# Patient Record
Sex: Female | Born: 2000 | Race: White | Hispanic: No | Marital: Single | Smoking: Never smoker
Health system: Southern US, Community
[De-identification: ages and names within clinical notes are randomized; demographics above are authoritative.]

## PROBLEM LIST (undated history)

## (undated) ENCOUNTER — Ambulatory Visit: Admission: EM

## (undated) DIAGNOSIS — F419 Anxiety disorder, unspecified: Secondary | ICD-10-CM

## (undated) DIAGNOSIS — F32A Depression, unspecified: Secondary | ICD-10-CM

---

## 2019-11-20 ENCOUNTER — Ambulatory Visit (HOSPITAL_COMMUNITY)
Admission: EM | Admit: 2019-11-20 | Discharge: 2019-11-20 | Disposition: A | Discharge status: Home / Self Care | Payer: 59 | Attending: Physician Assistant | Admitting: Physician Assistant

## 2019-11-20 ENCOUNTER — Other Ambulatory Visit: Payer: Self-pay

## 2019-11-20 ENCOUNTER — Encounter (HOSPITAL_COMMUNITY): Payer: Self-pay

## 2019-11-20 ENCOUNTER — Ambulatory Visit (INDEPENDENT_AMBULATORY_CARE_PROVIDER_SITE_OTHER): Payer: 59

## 2019-11-20 DIAGNOSIS — S022XXA Fracture of nasal bones, initial encounter for closed fracture: Secondary | ICD-10-CM | POA: Diagnosis not present

## 2019-11-20 DIAGNOSIS — S0992XA Unspecified injury of nose, initial encounter: Secondary | ICD-10-CM

## 2019-11-20 NOTE — Discharge Instructions (Signed)
There is a small fracture in your nose  Protect your nose, no contact sports  Call ENT Monday if you wish to have follow up, these typically heal well, you may also follow up with your PCP

## 2019-11-20 NOTE — ED Provider Notes (Signed)
[facility]    CSN: [id number] Arrival date & time: 11/20/19  1850      History   Chief Complaint Chief Complaint  Patient presents with  . Nose Injury    HPI Evelyn Haney is a 19 y.o. female.   Patient reports for nose injury.  She reports she was at work today and she was pulling bit of a trash can get stuck and then released striking her in the face/nose.  She reports immediate pain in her nose.  She reports some nose bleeding.  She reports that she felt the nose might have been a little crooked at the time and she moved and felt a crack.  She has never injured her nose before.  It did not take that long for the bleeding to stop.  It has been painful since then.  She has noticed a little swelling and bruising.     History reviewed. No pertinent past medical history.  There are no problems to display for this patient.   History reviewed. No pertinent surgical history.  OB History   No obstetric history on file.      Home Medications    Prior to Admission medications   Not on File    Family History Family History  Family history unknown: Yes    Social History Social History   Tobacco Use  . Smoking status: Never Smoker  Substance Use Topics  . Alcohol use: Not on file  . Drug use: Not on file     Allergies   Patient has no known allergies.   Review of Systems Review of Systems   Physical Exam Triage Vital Signs ED Triage Vitals [11/20/19 1908]  Enc Vitals Group     BP 105/64     Pulse Rate 61     Resp 18     Temp 97.8 F (36.6 C)     Temp Source Oral     SpO2 99 %     Weight      Height      Head Circumference      Peak Flow      Pain Score 7     Pain Loc      Pain Edu?      Excl. in [place]?    No data found.  Updated Vital Signs BP 105/64 (BP Location: Left Arm)   Pulse 61   Temp 97.8 F (36.6 C) (Oral)   Resp 18   LMP 11/05/2019 (Exact Date)   SpO2 99%   Visual Acuity Right Eye Distance:   Left Eye  Distance:   Bilateral Distance:    Right Eye Near:   Left Eye Near:    Bilateral Near:     Physical Exam Vitals and nursing note reviewed.  Constitutional:      Appearance: Normal appearance.  HENT:     Head:     Comments: There is swelling over the bridge of the nose and some ecchymosis.  Possible early swelling lateral to the nose bilaterally.  There are some dried blood in the naris but no active bleeding.  Bridge of nose is very tender to palpation.  Appears in line. Skin:    General: Skin is warm and dry.  Neurological:     Mental Status: She is alert.      UC Treatments / Results  Labs (all labs ordered are listed, but only abnormal results are displayed) Labs Reviewed - No data to display  EKG  Radiology DG Nasal Bones  Result Date: 11/20/2019 CLINICAL DATA:  Hit in face with trash can lead today, felt crack when moving nose. EXAM: NASAL BONES - 3+ VIEW COMPARISON:  None. FINDINGS: Minimal irregularity of the nasal bone suspicious for nondisplaced fracture. The nasal septum is midline. Remaining included facial bones appear intact. IMPRESSION: Minimal irregularity of the nasal bone suspicious for nondisplaced fracture. Electronically Signed   By: Keith Rake M.D.   On: 11/20/2019 20:01    Procedures Procedures (including critical care time)  Medications Ordered in UC Medications - No data to display  Initial Impression / Assessment and Plan / UC Course  I have reviewed the triage vital signs and the nursing notes.  Pertinent labs & imaging results that were available during my care of the patient were reviewed by me and considered in my medical decision making (see chart for details).     #Fracture nasal bone Patient is a 19 year old presenting with acute fracture for nasal bone.  X-ray positive for fracture.  Minimal to no displacement.  Not actively bleeding.  Discussed protecting the nose.  Discussed that as swelling comes down she can evaluate her  satisfaction with the cosmetics and that she can follow-up with ENT for evaluation to ensure the best cosmetic result.  Discussed no contact sports and what to do with nosebleed recurs.  She verbalized understanding plan of care. Final Clinical Impressions(s) / UC Diagnoses   Final diagnoses:  Closed fracture of nasal bone, initial encounter     Discharge Instructions     There is a small fracture in your nose  Protect your nose, no contact sports  Call ENT Monday if you wish to have follow up, these typically heal well, you may also follow up with your PCP    ED Prescriptions    None     PDMP not reviewed this encounter.   Purnell Shoemaker, PA-C 11/21/19 [phone]

## 2019-11-20 NOTE — ED Triage Notes (Signed)
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  Pt states he nose was bleeding when it initially happened and now it is swollen & tender.

## 2020-12-01 IMAGING — DX DG NASAL BONES 3+V
3 series · 3 of 3 positions shown · non-contrast
Comparison: None.

CLINICAL DATA: Hit in face with trash can lead today, felt crack
when moving nose.

EXAM:
NASAL BONES - 3+ VIEW

[[person_name]]
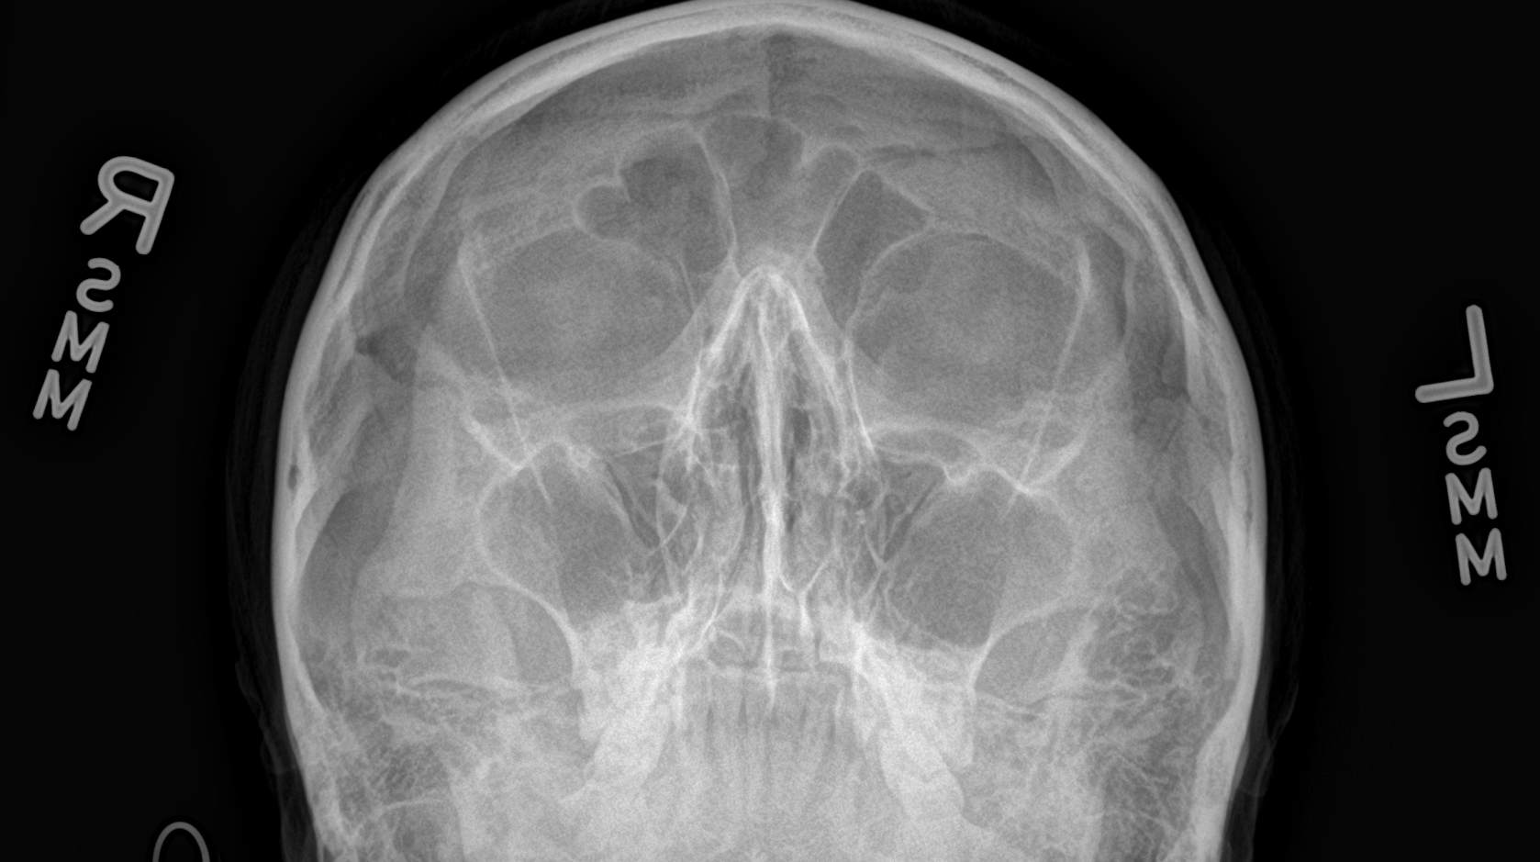

[nasal lat (1 of 2)]
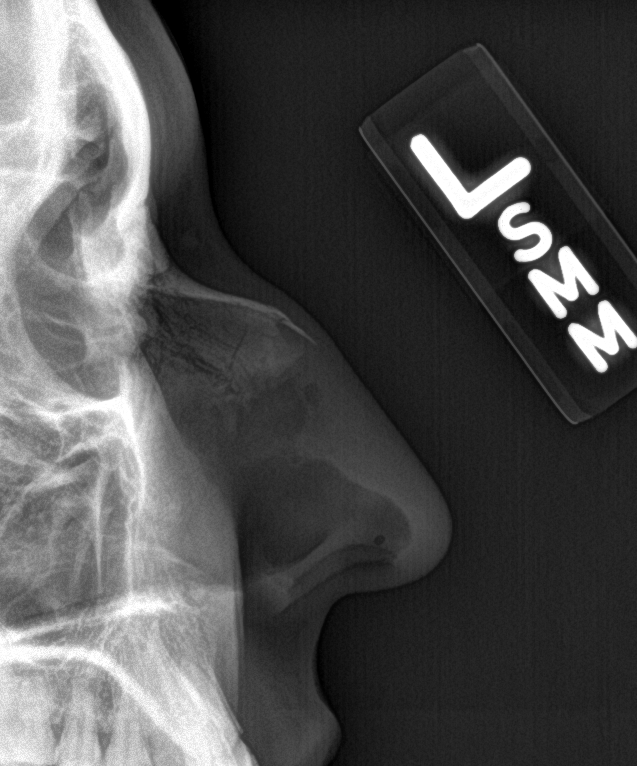

[nasal lat (2 of 2)]
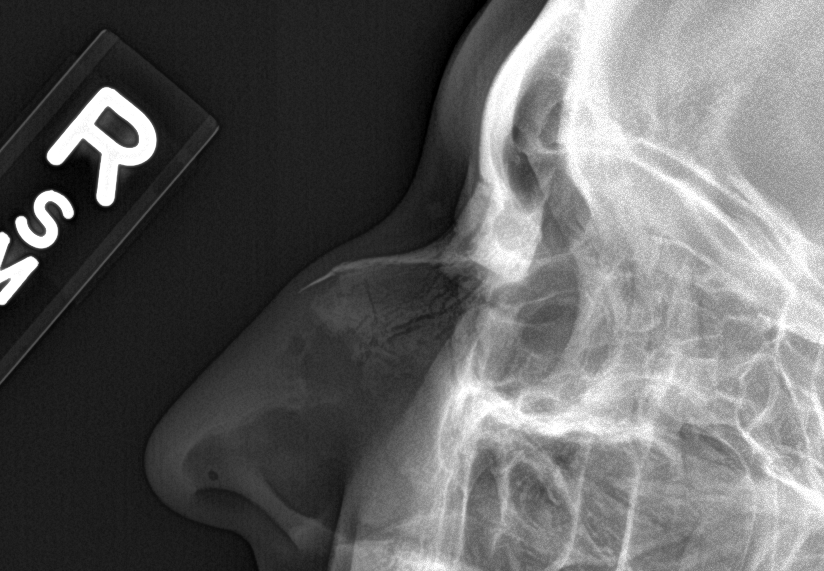

[3 of 3 positions shown; findings below may reference images not displayed]

FINDINGS: Minimal irregularity of the nasal bone suspicious for nondisplaced
fracture. The nasal septum is midline. Remaining included facial
bones appear intact.
IMPRESSION: Minimal irregularity of the nasal bone suspicious for nondisplaced
fracture.

## 2022-08-16 ENCOUNTER — Ambulatory Visit (HOSPITAL_COMMUNITY): Payer: Self-pay

## 2022-08-16 ENCOUNTER — Ambulatory Visit (HOSPITAL_COMMUNITY)
Admission: EM | Admit: 2022-08-16 | Discharge: 2022-08-16 | Disposition: A | Discharge status: Home / Self Care | Payer: BLUE CROSS/BLUE SHIELD | Attending: Family Medicine | Admitting: Family Medicine

## 2022-08-16 ENCOUNTER — Encounter (HOSPITAL_COMMUNITY): Payer: Self-pay | Admitting: Emergency Medicine

## 2022-08-16 ENCOUNTER — Other Ambulatory Visit: Payer: Self-pay

## 2022-08-16 ENCOUNTER — Ambulatory Visit (HOSPITAL_COMMUNITY): Payer: BLUE CROSS/BLUE SHIELD

## 2022-08-16 DIAGNOSIS — R051 Acute cough: Secondary | ICD-10-CM | POA: Diagnosis present

## 2022-08-16 DIAGNOSIS — Z1152 Encounter for screening for COVID-19: Secondary | ICD-10-CM | POA: Insufficient documentation

## 2022-08-16 DIAGNOSIS — R062 Wheezing: Secondary | ICD-10-CM | POA: Diagnosis present

## 2022-08-16 DIAGNOSIS — Z8616 Personal history of COVID-19: Secondary | ICD-10-CM | POA: Diagnosis not present

## 2022-08-16 DIAGNOSIS — J208 Acute bronchitis due to other specified organisms: Secondary | ICD-10-CM | POA: Diagnosis not present

## 2022-08-16 MED ORDER — PROMETHAZINE-DM 6.25-15 MG/5ML PO SYRP: 5.0000 mL | ORAL_SOLUTION | Freq: Three times a day (TID) | ORAL | 0 refills | Status: DC | PRN

## 2022-08-16 MED ORDER — PREDNISONE 20 MG PO TABS: 40.0000 mg | ORAL_TABLET | Freq: Every day | ORAL | 0 refills | Status: AC

## 2022-08-16 MED ORDER — ALBUTEROL SULFATE HFA 108 (90 BASE) MCG/ACT IN AERS
2.0000 | INHALATION_SPRAY | Freq: Once | RESPIRATORY_TRACT | Status: AC
  Administered 2022-08-16: 2 via RESPIRATORY_TRACT

## 2022-08-16 MED ORDER — ALBUTEROL SULFATE HFA 108 (90 BASE) MCG/ACT IN AERS
INHALATION_SPRAY | RESPIRATORY_TRACT | Status: AC
  Filled 2022-08-16: qty 6.7

## 2022-08-16 NOTE — ED Triage Notes (Signed)
Pt c/o increase cough, nasal congestion and sore throat since Monday. Denies any fever or chills.

## 2022-08-16 NOTE — Discharge Instructions (Signed)
I am concerned that you have a virus.  We will contact you if your COVID is positive.  Monitor your MyChart for these results.  Use albuterol every 4-6 hours as needed for shortness of breath and coughing fits.  Start prednisone in the morning.  Do not take NSAIDs with this medication including aspirin, ibuprofen/Advil, naproxen/Aleve.  You can use acetaminophen/Tylenol.  Take Promethazine DM for cough.  This will make you sleepy so do not drive drink alcohol with taking it.  You can use over-the-counter medications including Mucinex, Flonase, Tylenol for symptom relief.  If your symptoms or not improving within a week return for reevaluation.  If anything worsens you should be seen immediately including chest pain, shortness of breath, high fever, nausea/vomiting interfering with oral intake.

## 2022-08-16 NOTE — ED Provider Notes (Signed)
[place]    CSN: [id number] Arrival date & time: 08/16/22  1513      History   Chief Complaint Chief Complaint  Patient presents with   Cough    Bronchitis symptoms - Entered by patient   URI    HPI Evelyn Haney is a 22 y.o. female.   Patient presents today with a 4-day history of URI symptoms including cough, nasal congestion, sore throat, sinus pressure, chest tightness, shortness of breath.  She denies any chest pain, fever, nausea, vomiting, diarrhea.  She denies any known sick contacts but did start a new job where she is exposed to many people.  She has had COVID several years ago.  She had initial COVID vaccines but has not had more recent boosters.  She does have occasional seasonal allergies but these are generally managed with antihistamines.  Reports current symptoms are more extreme than previous episodes of allergies.  She has tried antihistamines as well as cold/flu medication without improvement of symptoms.  She denies any recent antibiotics or steroids.  She denies any significant past medical history including diabetes, cardiovascular disease, COPD, asthma, smoking.  She is confident that she is not pregnant.    History reviewed. No pertinent past medical history.  There are no problems to display for this patient.   History reviewed. No pertinent surgical history.  OB History   No obstetric history on file.      Home Medications    Prior to Admission medications   Medication Sig Start Date End Date Taking? Authorizing Provider  predniSONE (DELTASONE) 20 MG tablet Take 2 tablets (40 mg total) by mouth daily for 4 days. 08/16/22 08/20/22 Yes Guinn Delarosa K, PA-C  promethazine-dextromethorphan (PROMETHAZINE-DM) 6.25-15 MG/5ML syrup Take 5 mLs by mouth 3 (three) times daily as needed for cough. 08/16/22  Yes Raeann Offner, Derry Skill, PA-C    Family History Family History  Family history unknown: Yes    Social History Social History    Tobacco Use   Smoking status: Never     Allergies   Patient has no known allergies.   Review of Systems Review of Systems  Constitutional:  Positive for activity change. Negative for appetite change, fatigue and fever.  HENT:  Positive for congestion, sinus pressure and sore throat. Negative for sneezing.   Respiratory:  Positive for cough, chest tightness and shortness of breath.   Cardiovascular:  Negative for chest pain.  Gastrointestinal:  Negative for abdominal pain, diarrhea, nausea and vomiting.  Neurological:  Negative for dizziness, light-headedness and headaches.     Physical Exam Triage Vital Signs ED Triage Vitals  Enc Vitals Group     BP 08/16/22 1535 100/69     Pulse Rate 08/16/22 1535 92     Resp 08/16/22 1535 16     Temp 08/16/22 1535 98.8 F (37.1 C)     Temp Source 08/16/22 1535 Oral     SpO2 08/16/22 1535 98 %     Weight 08/16/22 1536 135 lb (61.2 kg)     Height 08/16/22 1536 '4\' 11"'$  (1.499 m)     Head Circumference --      Peak Flow --      Pain Score 08/16/22 1535 4     Pain Loc --      Pain Edu? --      Excl. in [place]? --    No data found.  Updated Vital Signs BP 100/69 (BP Location: Right Arm)   Pulse 92  Temp 98.8 F (37.1 C) (Oral)   Resp 16   Ht '4\' 11"'$  (1.499 m)   Wt 135 lb (61.2 kg)   LMP 07/20/2022 (Approximate)   SpO2 98%   BMI 27.27 kg/m   Visual Acuity Right Eye Distance:   Left Eye Distance:   Bilateral Distance:    Right Eye Near:   Left Eye Near:    Bilateral Near:     Physical Exam Vitals reviewed.  Constitutional:      General: She is awake. She is not in acute distress.    Appearance: Normal appearance. She is well-developed. She is not ill-appearing.     Comments: Very pleasant female who stated age in no acute distress sitting comfortably in exam room  HENT:     Head: Normocephalic and atraumatic.     Right Ear: Tympanic membrane, ear canal and external ear normal. Tympanic membrane is not erythematous or  bulging.     Left Ear: Tympanic membrane, ear canal and external ear normal. Tympanic membrane is not erythematous or bulging.     Nose:     Right Sinus: Maxillary sinus tenderness present. No frontal sinus tenderness.     Left Sinus: Maxillary sinus tenderness present. No frontal sinus tenderness.     Mouth/Throat:     Pharynx: Uvula midline. No oropharyngeal exudate or posterior oropharyngeal erythema.  Cardiovascular:     Rate and Rhythm: Normal rate and regular rhythm.     Heart sounds: Normal heart sounds, S1 normal and S2 normal. No murmur heard. Pulmonary:     Effort: Pulmonary effort is normal.     Breath sounds: Examination of the right-lower field reveals decreased breath sounds. Examination of the left-lower field reveals decreased breath sounds. Decreased breath sounds present. No wheezing, rhonchi or rales.     Comments: Reactive cough with deep breathing Psychiatric:        Behavior: Behavior is cooperative.      UC Treatments / Results  Labs (all labs ordered are listed, but only abnormal results are displayed) Labs Reviewed  SARS CORONAVIRUS 2 (TAT 6-24 HRS)    EKG   Radiology No results found.  Procedures Procedures (including critical care time)  Medications Ordered in UC Medications  albuterol (VENTOLIN HFA) 108 (90 Base) MCG/ACT inhaler 2 puff (2 puffs Inhalation Given 08/16/22 1611)    Initial Impression / Assessment and Plan / UC Course  I have reviewed the triage vital signs and the nursing notes.  Pertinent labs & imaging results that were available during my care of the patient were reviewed by me and considered in my medical decision making (see chart for details).     Patient is well-appearing, afebrile, nontoxic, nontachycardic.  No evidence of acute infection on physical exam that would warrant initiation of antibiotics.  Flu testing was deferred as she has been symptomatic for several days and this would not change management.  COVID  testing was obtained and is pending.  She is young and otherwise healthy so not a candidate for antivirals.  She was given several puffs of an albuterol inhaler with significant improvement of symptoms.  She was sent home with this medication with instruction to use this every 4-6 hours as needed.  Will start prednisone burst of 40 mg for 4 days.  Discussed that she should not take NSAIDs with this medication due to risk of GI bleeding but can use over-the-counter medications including Mucinex, Tylenol, Flonase.  She was given Promethazine DM for cough.  Discussed  that this can be sedating and she is not to drive or drink alcohol while taking it.  Recommended that she rest and drink plenty of fluids.  If she has any worsening or changing symptoms including fever, chest pain, shortness of breath, nausea/vomiting interfering with oral intake, weakness she needs to be seen immediately.  Strict return precautions given.  Work excuse note provided.  Final Clinical Impressions(s) / UC Diagnoses   Final diagnoses:  Viral bronchitis  Wheezing  Acute cough     Discharge Instructions      I am concerned that you have a virus.  We will contact you if your COVID is positive.  Monitor your MyChart for these results.  Use albuterol every 4-6 hours as needed for shortness of breath and coughing fits.  Start prednisone in the morning.  Do not take NSAIDs with this medication including aspirin, ibuprofen/Advil, naproxen/Aleve.  You can use acetaminophen/Tylenol.  Take Promethazine DM for cough.  This will make you sleepy so do not drive drink alcohol with taking it.  You can use over-the-counter medications including Mucinex, Flonase, Tylenol for symptom relief.  If your symptoms or not improving within a week return for reevaluation.  If anything worsens you should be seen immediately including chest pain, shortness of breath, high fever, nausea/vomiting interfering with oral intake.     ED Prescriptions      Medication Sig Dispense Auth. Provider   predniSONE (DELTASONE) 20 MG tablet Take 2 tablets (40 mg total) by mouth daily for 4 days. 8 tablet Ryane Canavan K, PA-C   promethazine-dextromethorphan (PROMETHAZINE-DM) 6.25-15 MG/5ML syrup Take 5 mLs by mouth 3 (three) times daily as needed for cough. 118 mL Kaydra Borgen K, PA-C      PDMP not reviewed this encounter.   Terrilee Croak, PA-C 08/16/22 1625

## 2022-08-17 LAB — SARS CORONAVIRUS 2 (TAT 6-24 HRS): SARS Coronavirus 2: NEGATIVE

## 2022-09-06 ENCOUNTER — Ambulatory Visit (HOSPITAL_COMMUNITY)
Admission: RE | Admit: 2022-09-06 | Discharge: 2022-09-06 | Disposition: A | Discharge status: Home / Self Care | Payer: BLUE CROSS/BLUE SHIELD | Source: Ambulatory Visit | Attending: Emergency Medicine | Admitting: Emergency Medicine

## 2022-09-06 ENCOUNTER — Encounter (HOSPITAL_COMMUNITY): Payer: Self-pay

## 2022-09-06 VITALS — BP 106/69 | HR 102 | Temp 98.4°F | Resp 18

## 2022-09-06 DIAGNOSIS — Z1152 Encounter for screening for COVID-19: Secondary | ICD-10-CM | POA: Diagnosis not present

## 2022-09-06 DIAGNOSIS — J069 Acute upper respiratory infection, unspecified: Secondary | ICD-10-CM | POA: Insufficient documentation

## 2022-09-06 LAB — POC INFLUENZA A AND B ANTIGEN (URGENT CARE ONLY)
INFLUENZA A ANTIGEN, POC: NEGATIVE
INFLUENZA B ANTIGEN, POC: NEGATIVE

## 2022-09-06 LAB — POCT RAPID STREP A, ED / UC: Streptococcus, Group A Screen (Direct): NEGATIVE

## 2022-09-06 NOTE — ED Triage Notes (Signed)
Pt states she started with fever 101 yesterday. She states today sore throat, headache, chills and fever. She took motrin and nyquil last night

## 2022-09-06 NOTE — Discharge Instructions (Addendum)
Your rapid strep is negative in clinic.  Your influenza testing was negative as well.  We have sent off COVID-19 testing and will call if positive.  Your results will be available in MyChart if positive or negative.  Please continue symptomatic management at home.  You can take Tylenol and ibuprofen every 4-6 hours, alternating between the 2 for any fever, body aches, headaches or discomfort.  For your sore throat you can do warm saline gargles, tea with honey and sleep with a humidifier.  Please return to clinic if your symptoms do not improve within the next week, or follow-up with your primary care.  Please seek immediate care if you develop shortness of breath, high fever despite medication, or any worsening symptoms.

## 2022-09-06 NOTE — ED Provider Notes (Signed)
[place]    CSN: [id number] Arrival date & time: 09/06/22  1607      History   Chief Complaint Chief Complaint  Patient presents with   Fever    Chills, aches, sore throat, fever, headache - Entered by patient   Sore Throat   Headache    HPI Evelyn Haney is a 22 y.o. female.   Reports mid-afternoon yesterday she started to feel body aches, fever of 101 last night. Sore throat today.  Denies cough. Denies abdominal pain, N/V/D, CP or SOB.   Took motrin and Nyquil without much relief.  No known sick contacts.  Denies smoking history.  The history is provided by the patient and medical records.  Fever Associated symptoms: chills, congestion, headaches and sore throat   Associated symptoms: no chest pain, no cough, no diarrhea, no dysuria, no nausea and no vomiting   Sore Throat Associated symptoms include headaches. Pertinent negatives include no chest pain, no abdominal pain and no shortness of breath.  Headache Associated symptoms: congestion, fever and sore throat   Associated symptoms: no abdominal pain, no cough, no diarrhea, no fatigue, no nausea and no vomiting     History reviewed. No pertinent past medical history.  There are no problems to display for this patient.   History reviewed. No pertinent surgical history.  OB History   No obstetric history on file.      Home Medications    Prior to Admission medications   Medication Sig Start Date End Date Taking? Authorizing Provider  AUROVELA 24 FE 1-20 MG-MCG(24) tablet Take 1 tablet by mouth daily.   Yes [provider]    Family History Family History  Problem Relation Age of Onset   Healthy Mother    Skin cancer Father     Social History Social History   Tobacco Use   Smoking status: Never   Smokeless tobacco: Never  Vaping Use   Vaping Use: Never used  Substance Use Topics   Alcohol use: Yes    Comment: socially   Drug use: Yes    Types: Marijuana     Comment: socially     Allergies   Patient has no known allergies.   Review of Systems Review of Systems  Constitutional:  Positive for chills and fever. Negative for diaphoresis and fatigue.  HENT:  Positive for congestion and sore throat. Negative for trouble swallowing and voice change.   Eyes:  Negative for discharge.  Respiratory:  Negative for cough and shortness of breath.   Cardiovascular:  Negative for chest pain.  Gastrointestinal:  Negative for abdominal pain, diarrhea, nausea and vomiting.  Genitourinary:  Negative for dysuria.  Neurological:  Positive for headaches. Negative for syncope.     Physical Exam Triage Vital Signs ED Triage Vitals  Enc Vitals Group     BP 09/06/22 1627 106/69     Pulse Rate 09/06/22 1627 (!) 102     Resp 09/06/22 1627 18     Temp 09/06/22 1627 98.4 F (36.9 C)     Temp Source 09/06/22 1627 Oral     SpO2 09/06/22 1627 95 %     Weight --      Height --      Head Circumference --      Peak Flow --      Pain Score 09/06/22 1625 7     Pain Loc --      Pain Edu? --      Excl. in  [place]? --    No data found.  Updated Vital Signs BP 106/69 (BP Location: Left Arm)   Pulse (!) 102   Temp 98.4 F (36.9 C) (Oral)   Resp 18   LMP 08/17/2022 (Exact Date)   SpO2 95%   Visual Acuity Right Eye Distance:   Left Eye Distance:   Bilateral Distance:    Right Eye Near:   Left Eye Near:    Bilateral Near:     Physical Exam Vitals and nursing note reviewed.  Constitutional:      General: She is not in acute distress.    Appearance: She is well-developed.  HENT:     Head: Normocephalic and atraumatic.     Nose: Congestion and rhinorrhea present.     Mouth/Throat:     Mouth: Mucous membranes are moist.     Pharynx: Posterior oropharyngeal erythema present.     Tonsils: No tonsillar exudate or tonsillar abscesses. 2+ on the right. 2+ on the left.  Eyes:     Conjunctiva/sclera: Conjunctivae normal.  Cardiovascular:     Rate and  Rhythm: Normal rate and regular rhythm.  Pulmonary:     Effort: Pulmonary effort is normal. No respiratory distress.  Musculoskeletal:        General: No swelling.     Cervical back: Normal range of motion and neck supple.  Lymphadenopathy:     Cervical: Cervical adenopathy present.  Skin:    General: Skin is warm and dry.     Capillary Refill: Capillary refill takes less than 2 seconds.  Neurological:     Mental Status: She is alert and oriented to person, place, and time.  Psychiatric:        Mood and Affect: Mood normal.        Behavior: Behavior normal.      UC Treatments / Results  Labs (all labs ordered are listed, but only abnormal results are displayed) Labs Reviewed  SARS CORONAVIRUS 2 (TAT 6-24 HRS)  POCT RAPID STREP A, ED / UC  POC INFLUENZA A AND B ANTIGEN (URGENT CARE ONLY)    EKG   Radiology No results found.  Procedures Procedures (including critical care time)  Medications Ordered in UC Medications - No data to display  Initial Impression / Assessment and Plan / UC Course  I have reviewed the triage vital signs and the nursing notes.  Pertinent labs & imaging results that were available during my care of the patient were reviewed by me and considered in my medical decision making (see chart for details).  Vitals in triage reviewed, patient is hemodynamically stable.  One day onset of fevers, body aches and chills.  Rapid strep performed by nursing staff was negative.  Negative flu swab in clinic.  COVID-19 test is pending.  Discussed symptomatic management with Tylenol and ibuprofen and other measures.  Patient verbalized understanding of treatment plan, follow-up and return precautions reviewed.  No questions at this time.    Final Clinical Impressions(s) / UC Diagnoses   Final diagnoses:  Viral upper respiratory tract infection     Discharge Instructions      Your rapid strep is negative in clinic.  Your influenza testing was negative as  well.  We have sent off COVID-19 testing and will call if positive.  Your results will be available in MyChart if positive or negative.  Please continue symptomatic management at home.  You can take Tylenol and ibuprofen every 4-6 hours, alternating between the 2 for any fever,  body aches, headaches or discomfort.  For your sore throat you can do warm saline gargles, tea with honey and sleep with a humidifier.  Please return to clinic if your symptoms do not improve within the next week, or follow-up with your primary care.  Please seek immediate care if you develop shortness of breath, high fever despite medication, or any worsening symptoms.      ED Prescriptions   None    PDMP not reviewed this encounter.   Octavius Shin, [place], [place] 09/06/22 [phone]

## 2022-09-07 LAB — SARS CORONAVIRUS 2 (TAT 6-24 HRS): SARS Coronavirus 2: NEGATIVE

## 2022-09-12 ENCOUNTER — Ambulatory Visit: Payer: Self-pay

## 2022-09-13 ENCOUNTER — Ambulatory Visit (INDEPENDENT_AMBULATORY_CARE_PROVIDER_SITE_OTHER): Payer: BLUE CROSS/BLUE SHIELD | Admitting: Family Medicine

## 2022-09-13 ENCOUNTER — Ambulatory Visit (INDEPENDENT_AMBULATORY_CARE_PROVIDER_SITE_OTHER): Payer: BLUE CROSS/BLUE SHIELD

## 2022-09-13 ENCOUNTER — Encounter: Payer: Self-pay | Admitting: Family Medicine

## 2022-09-13 VITALS — BP 110/66 | HR 111 | Temp 97.8°F | Ht 59.0 in | Wt 142.0 lb

## 2022-09-13 DIAGNOSIS — F419 Anxiety disorder, unspecified: Secondary | ICD-10-CM | POA: Diagnosis not present

## 2022-09-13 DIAGNOSIS — R058 Other specified cough: Secondary | ICD-10-CM

## 2022-09-13 DIAGNOSIS — R062 Wheezing: Secondary | ICD-10-CM

## 2022-09-13 DIAGNOSIS — F32A Depression, unspecified: Secondary | ICD-10-CM

## 2022-09-13 DIAGNOSIS — R519 Headache, unspecified: Secondary | ICD-10-CM | POA: Diagnosis not present

## 2022-09-13 MED ORDER — PREDNISONE 20 MG PO TABS: 40.0000 mg | ORAL_TABLET | Freq: Every day | ORAL | 0 refills | Status: DC

## 2022-09-13 MED ORDER — AZITHROMYCIN 250 MG PO TABS: ORAL_TABLET | ORAL | 0 refills | Status: AC

## 2022-09-13 MED ORDER — ESCITALOPRAM OXALATE 5 MG PO TABS: 5.0000 mg | ORAL_TABLET | Freq: Every day | ORAL | 1 refills | Status: DC

## 2022-09-13 MED ORDER — BENZONATATE 200 MG PO CAPS: 200.0000 mg | ORAL_CAPSULE | Freq: Two times a day (BID) | ORAL | 0 refills | Status: DC | PRN

## 2022-09-13 NOTE — Progress Notes (Signed)
New Patient Office Visit  Subjective    Patient ID: Evelyn Haney, female    DOB: [birth year]  Age: 22 y.o. MRN: [id number]  CC:  Chief Complaint  Patient presents with   Establish Care    Has been having ongoing cough for about a month now, has been to UC and was viral bronchitis and was wondering if there is any way to recheck as she is still having some wheezing and SOB only when coughing    HPI Evelyn Haney presents to establish care No PCP recently.   [facility]   C/o a 4-5 wk hx of dry cough but occasional green phlegm. Pain in chest with cough and shortness of breath when coughing.  She has been to urgent care twice for this.  She took a course of oral steroids. Albuterol inhaler prescribed.   She had a fever at one point but that resolved.   No hx of asthma.  Denies smoking hx.   Anxiety and depression. She used to be on Lexapro and would like to start back.  Also requests psychiatry recommendations.   No SI. No self harm.  No hx of addiction.      09/13/2022    1:08 PM  Depression screen PHQ 2/9  Decreased Interest 2  Down, Depressed, Hopeless 2  PHQ - 2 Score 4  Altered sleeping 2  Tired, decreased energy 3  Change in appetite 3  Feeling bad or failure about yourself  0  Trouble concentrating 0  Moving slowly or fidgety/restless 0  Suicidal thoughts 0  PHQ-9 Score 12  Difficult doing work/chores Very difficult      Outpatient Encounter Medications as of 09/13/2022  Medication Sig   AUROVELA 24 FE 1-20 MG-MCG(24) tablet Take 1 tablet by mouth daily.   azithromycin (ZITHROMAX) 250 MG tablet Take 2 tablets on day 1, then 1 tablet daily on days 2 through 5   benzonatate (TESSALON) 200 MG capsule Take 1 capsule (200 mg total) by mouth 2 (two) times daily as needed for cough.   escitalopram (LEXAPRO) 5 MG tablet Take 1 tablet (5 mg total) by mouth daily.   predniSONE (DELTASONE) 20 MG tablet Take 2 tablets (40 mg total) by mouth  daily with breakfast.   No facility-administered encounter medications on file as of 09/13/2022.    History reviewed. No pertinent past medical history.  History reviewed. No pertinent surgical history.  Family History  Problem Relation Age of Onset   Healthy Mother    Skin cancer Father     Social History   Socioeconomic History   Marital status: Single    Spouse name: Not on file   Number of children: Not on file   Years of education: Not on file   Highest education level: Not on file  Occupational History   Not on file  Tobacco Use   Smoking status: Never   Smokeless tobacco: Never  Vaping Use   Vaping Use: Never used  Substance and Sexual Activity   Alcohol use: Yes    Comment: socially   Drug use: Yes    Types: Marijuana    Comment: socially   Sexual activity: Yes    Birth control/protection: Pill, Condom  Other Topics Concern   Not on file  Social History Narrative   Not on file   Social Determinants of Health   Financial Resource Strain: Not on file  Food Insecurity: Not on file  Transportation Needs: Not on file  Physical Activity: Not on file  Stress: Not on file  Social Connections: Not on file  Intimate Partner Violence: Not on file    ROS      Objective    BP 110/66 (BP Location: Left Arm, Patient Position: Sitting, Cuff Size: Large)   Pulse (!) 111   Temp 97.8 F (36.6 C) (Temporal)   Ht 4\' 11"  (1.499 m)   Wt 142 lb (64.4 kg)   LMP 08/17/2022 (Exact Date)   SpO2 98%   BMI 28.68 kg/m   Physical Exam Constitutional:      General: She is not in acute distress.    Appearance: She is not ill-appearing.  HENT:     Right Ear: Tympanic membrane and ear canal normal.     Left Ear: Tympanic membrane and ear canal normal.     Nose: Nose normal.     Mouth/Throat:     Mouth: Mucous membranes are moist.     Pharynx: No oropharyngeal exudate or posterior oropharyngeal erythema.  Eyes:     Extraocular Movements: Extraocular movements  intact.     Conjunctiva/sclera: Conjunctivae normal.  Cardiovascular:     Rate and Rhythm: Normal rate and regular rhythm.  Pulmonary:     Effort: Pulmonary effort is normal.     Breath sounds: Normal breath sounds.  Musculoskeletal:     Cervical back: Normal range of motion and neck supple. No tenderness.     Right lower leg: No edema.     Left lower leg: No edema.  Lymphadenopathy:     Cervical: No cervical adenopathy.  Skin:    General: Skin is warm and dry.  Neurological:     General: No focal deficit present.     Mental Status: She is alert and oriented to person, place, and time.  Psychiatric:        Mood and Affect: Mood normal.        Behavior: Behavior normal.        Thought Content: Thought content normal.         Assessment & Plan:   Problem List Items Addressed This Visit   None Visit Diagnoses     Cough present for greater than 3 weeks    -  Primary   Relevant Medications   azithromycin (ZITHROMAX) 250 MG tablet   predniSONE (DELTASONE) 20 MG tablet   benzonatate (TESSALON) 200 MG capsule   Other Relevant Orders   DG Chest 2 View (Completed)   Acute nonintractable headache, unspecified headache type       Relevant Medications   escitalopram (LEXAPRO) 5 MG tablet   Wheezing       Relevant Medications   predniSONE (DELTASONE) 20 MG tablet   Other Relevant Orders   DG Chest 2 View (Completed)   Anxiety and depression       Relevant Medications   escitalopram (LEXAPRO) 5 MG tablet      Encounter to establish care.  Z-pak, oral prednisone, and Tessalon prescribed. CXR ordered.  Take Mucinex DM. Use albuterol prn.  Follow up if worsening or not back to baseline in 2 wks.  Start Lexapro for anxiety and depression. Referral to psychiatrist per request.     Return in about 4 weeks (around 10/11/2022).   Hetty Blend, NP-C

## 2022-09-13 NOTE — Patient Instructions (Addendum)
Please go downstairs for chest x-ray before you leave.  Take the antibiotic and oral steroids with food and a full glass of water.  The steroids may cause you to have difficulty sleeping while you are taking them.  Use your albuterol inhaler  Take Mucinex dm.  Drink plenty water  I prescribed a cough medication called Tessalon Perles that you may also take.  I will be in touch with your results.  Follow-up if you are getting worse or if you are not back to baseline in 10 to 14 days.     You can call to schedule with a psychiatrist.  A few offices are listed below for you to call.   [email or web address]  [email or web address]     [facility] [address] [place], [place] [address]  Phone: [phone]  [facility]  [address], [place], [place] [address]  Phone: [phone]    [facility] [address] [place], [place] [address] [phone]   Obgyn Offices:   [facility] [address] [place], [place] [address] [phone]   [facility] Ano [address] [place], [place] [address] Phone: [phone]   [facility] [address] [place], [place] [address] Phone: [phone]

## 2022-10-11 ENCOUNTER — Encounter: Payer: Self-pay | Admitting: Family Medicine

## 2022-10-11 ENCOUNTER — Ambulatory Visit (INDEPENDENT_AMBULATORY_CARE_PROVIDER_SITE_OTHER): Payer: BLUE CROSS/BLUE SHIELD | Admitting: Family Medicine

## 2022-10-11 VITALS — BP 110/70 | HR 67 | Temp 97.8°F | Ht 59.0 in | Wt 143.0 lb

## 2022-10-11 DIAGNOSIS — F32A Depression, unspecified: Secondary | ICD-10-CM | POA: Diagnosis not present

## 2022-10-11 DIAGNOSIS — F419 Anxiety disorder, unspecified: Secondary | ICD-10-CM

## 2022-10-11 DIAGNOSIS — Z23 Encounter for immunization: Secondary | ICD-10-CM | POA: Diagnosis not present

## 2022-10-11 MED ORDER — ESCITALOPRAM OXALATE 10 MG PO TABS: 10.0000 mg | ORAL_TABLET | Freq: Every day | ORAL | 2 refills | Status: DC

## 2022-10-11 NOTE — Progress Notes (Signed)
Subjective:     Patient ID: Evelyn Haney, female    DOB: 02-Feb-2001, 22 y.o.   MRN: [id number]  Chief Complaint  Patient presents with   Anxiety    F/u on lexapro, states has been doing alright, wants to discuss moving up to 10 mg due to still having troubles going and staying asleep along with focusing    Anxiety Symptoms include insomnia and nervous/anxious behavior. Patient reports no chest pain, nausea, palpitations, shortness of breath or suicidal ideas.     Patient is in today for f/u on anxiety and depression. Restarted Lexapro 5 mg approximately 4 weeks ago. She was out of her medication for some time.  She had been on 10 mg of Lexapro in the past and did well. Would like to increase her dose. Noticed some improvement in anxiety and depression but still having issues with concentration, focus and sleep.  Denies hx of ADHD or testing.   Tdap overdue   Plans to schedule with OB/GYN for her annual.      10/11/2022    1:16 PM 09/13/2022    1:08 PM  Depression screen PHQ 2/9  Decreased Interest 1 2  Down, Depressed, Hopeless 1 2  PHQ - 2 Score 2 4  Altered sleeping 3 2  Tired, decreased energy 2 3  Change in appetite 0 3  Feeling bad or failure about yourself  1 0  Trouble concentrating 1 0  Moving slowly or fidgety/restless 0 0  Suicidal thoughts 0 0  PHQ-9 Score 9 12  Difficult doing work/chores Somewhat difficult Very difficult      10/11/2022    1:17 PM 09/13/2022    1:08 PM  GAD 7 : Generalized Anxiety Score  Nervous, Anxious, on Edge 1 3  Control/stop worrying 1 2  Worry too much - different things 1 3  Trouble relaxing 1 2  Restless 0 0  Easily annoyed or irritable 0 0  Afraid - awful might happen 0 0  Total GAD 7 Score 4 10  Anxiety Difficulty Somewhat difficult Somewhat difficult      Health Maintenance Due  Topic Date Due   HIV Screening  Never done   Hepatitis C Screening  Never done   PAP-Cervical Cytology Screening  Never done   PAP  SMEAR-Modifier  Never done    History reviewed. No pertinent past medical history.  History reviewed. No pertinent surgical history.  Family History  Problem Relation Age of Onset   Healthy Mother    Skin cancer Father     Social History   Socioeconomic History   Marital status: Single    Spouse name: Not on file   Number of children: Not on file   Years of education: Not on file   Highest education level: Not on file  Occupational History   Not on file  Tobacco Use   Smoking status: Never   Smokeless tobacco: Never  Vaping Use   Vaping Use: Never used  Substance and Sexual Activity   Alcohol use: Yes    Comment: socially   Drug use: Yes    Types: Marijuana    Comment: socially   Sexual activity: Yes    Birth control/protection: Pill, Condom  Other Topics Concern   Not on file  Social History Narrative   Not on file   Social Determinants of Health   Financial Resource Strain: Not on file  Food Insecurity: Not on file  Transportation Needs: Not on file  Physical  Activity: Not on file  Stress: Not on file  Social Connections: Not on file  Intimate Partner Violence: Not on file    Outpatient Medications Prior to Visit  Medication Sig Dispense Refill   AUROVELA 24 FE 1-20 MG-MCG(24) tablet Take 1 tablet by mouth daily.     escitalopram (LEXAPRO) 5 MG tablet Take 1 tablet (5 mg total) by mouth daily. 30 tablet 1   benzonatate (TESSALON) 200 MG capsule Take 1 capsule (200 mg total) by mouth 2 (two) times daily as needed for cough. (Patient not taking: Reported on 10/11/2022) 20 capsule 0   predniSONE (DELTASONE) 20 MG tablet Take 2 tablets (40 mg total) by mouth daily with breakfast. (Patient not taking: Reported on 10/11/2022) 10 tablet 0   No facility-administered medications prior to visit.    No Known Allergies  Review of Systems  Constitutional:  Negative for chills and fever.  Respiratory:  Negative for shortness of breath.   Cardiovascular:  Negative  for chest pain and palpitations.  Gastrointestinal:  Negative for diarrhea, nausea and vomiting.  Neurological:  Negative for sensory change and weakness.  Psychiatric/Behavioral:  Positive for depression. Negative for suicidal ideas. The patient is nervous/anxious and has insomnia.        Objective:    Physical Exam Constitutional:      General: She is not in acute distress.    Appearance: She is not ill-appearing.  Eyes:     Extraocular Movements: Extraocular movements intact.     Conjunctiva/sclera: Conjunctivae normal.  Cardiovascular:     Rate and Rhythm: Normal rate.  Pulmonary:     Effort: Pulmonary effort is normal.  Musculoskeletal:     Cervical back: Normal range of motion.  Skin:    General: Skin is warm and dry.  Neurological:     General: No focal deficit present.     Mental Status: She is alert and oriented to person, place, and time.  Psychiatric:        Mood and Affect: Mood normal.        Behavior: Behavior normal.        Thought Content: Thought content normal.     BP 110/70 (BP Location: Left Arm, Patient Position: Sitting, Cuff Size: Large)   Pulse 67   Temp 97.8 F (36.6 C) (Temporal)   Ht 4\' 11"  (1.499 m)   Wt 143 lb (64.9 kg)   SpO2 98%   BMI 28.88 kg/m  Wt Readings from Last 3 Encounters:  10/11/22 143 lb (64.9 kg)  09/13/22 142 lb (64.4 kg)  08/16/22 135 lb (61.2 kg)       Assessment & Plan:   Problem List Items Addressed This Visit   None Visit Diagnoses     Anxiety and depression    -  Primary   Relevant Medications   escitalopram (LEXAPRO) 10 MG tablet   Need for diphtheria-tetanus-pertussis (Tdap) vaccine       Relevant Orders   Tdap vaccine greater than or equal to 7yo IM (Completed)      This is a 4-week follow-up on anxiety and depression and starting Lexapro 5 mg daily.  She is doing well but would like to increase to 10 mg daily.  She was previously on this dose and her mood was controlled.  Lexapro 10 mg prescribed.   Follow-up in 3 months or sooner if needed.  Tdap overdue.  Counseling on all components of the vaccine. She will call and schedule with her OB/GYN for her  annual exam.  I have discontinued Avigail D. Vondrak's predniSONE, benzonatate, and escitalopram. I am also having her start on escitalopram. Additionally, I am having her maintain her Aurovela 24 FE.  Meds ordered this encounter  Medications   escitalopram (LEXAPRO) 10 MG tablet    Sig: Take 1 tablet (10 mg total) by mouth at bedtime.    Dispense:  30 tablet    Refill:  2    Order Specific Question:   Supervising Provider    Answer:   Hillard Danker A [[id number]]

## 2022-12-06 ENCOUNTER — Other Ambulatory Visit: Payer: Self-pay | Admitting: Family Medicine

## 2022-12-06 DIAGNOSIS — F419 Anxiety disorder, unspecified: Secondary | ICD-10-CM

## 2022-12-07 NOTE — Telephone Encounter (Signed)
LOV:10/11/22 Last fill: 10/11/22, 30 tablets 2 refill

## 2022-12-10 NOTE — Telephone Encounter (Signed)
Last fill: 10/11/22, 30 tablets 2 refill LOV: 10/11/22

## 2022-12-20 ENCOUNTER — Encounter (HOSPITAL_COMMUNITY): Payer: Self-pay

## 2022-12-20 ENCOUNTER — Ambulatory Visit (HOSPITAL_COMMUNITY)
Admission: RE | Admit: 2022-12-20 | Discharge: 2022-12-20 | Disposition: A | Discharge status: Home / Self Care | Payer: BLUE CROSS/BLUE SHIELD | Source: Ambulatory Visit | Attending: Nurse Practitioner | Admitting: Nurse Practitioner

## 2022-12-20 VITALS — BP 110/72 | HR 89 | Temp 98.5°F | Resp 16

## 2022-12-20 DIAGNOSIS — R112 Nausea with vomiting, unspecified: Secondary | ICD-10-CM | POA: Diagnosis not present

## 2022-12-20 DIAGNOSIS — R197 Diarrhea, unspecified: Secondary | ICD-10-CM | POA: Diagnosis not present

## 2022-12-20 MED ORDER — ONDANSETRON 4 MG PO TBDP: 4.0000 mg | ORAL_TABLET | Freq: Three times a day (TID) | ORAL | 0 refills | Status: DC | PRN

## 2022-12-20 NOTE — ED Provider Notes (Signed)
[facility]    CSN: [id number] Arrival date & time: 12/20/22  1246      History   Chief Complaint Chief Complaint  Patient presents with   Fever    Food poisoning; nausea, diarrhea, malaise and low grade fever - Entered by patient    HPI Evelyn Haney is a 22 y.o. female.   Patient presents today with 1 day history of diarrhea, nausea, and vomiting.  She also endorses tactile fever.  Reports she has had several episodes of nonbloody, runny bowel movements today.  No blood in the stool.  Reports stools not pure water.  She also feels nauseous and vomited last night, no blood in the vomit.  Reports she has "spit up" a lot today but no frank vomiting today.  She also has abdominal cramping prior to the diarrhea.  Appetite has been decreased today, however she is tolerating liquids.  No loss of taste or smell, recent foreign travel, similar illness and known contacts, or recent antibiotic use.  Reports she may have ate some chicken that was little bit "pink."  Has taken Pepto-Bismol and ibuprofen for symptoms with minimal improvement.    History reviewed. No pertinent past medical history.  There are no problems to display for this patient.   History reviewed. No pertinent surgical history.  OB History   No obstetric history on file.      Home Medications    Prior to Admission medications   Medication Sig Start Date End Date Taking? Authorizing Provider  ondansetron (ZOFRAN-ODT) 4 MG disintegrating tablet Take 1 tablet (4 mg total) by mouth every 8 (eight) hours as needed for nausea or vomiting. 12/20/22  Yes Cathlean Marseilles A, NP  AUROVELA 24 FE 1-20 MG-MCG(24) tablet Take 1 tablet by mouth daily.    [provider]  escitalopram (LEXAPRO) 10 MG tablet TAKE 1 TABLET(10 MG) BY MOUTH AT BEDTIME 12/10/22   Avanell Shackleton, NP-C    Family History Family History  Problem Relation Age of Onset   Healthy Mother    Skin cancer Father     Social  History Social History   Tobacco Use   Smoking status: Never   Smokeless tobacco: Never  Vaping Use   Vaping status: Never Used  Substance Use Topics   Alcohol use: Yes    Comment: socially   Drug use: Yes    Types: Marijuana    Comment: socially     Allergies   Patient has no known allergies.   Review of Systems Review of Systems Per HPI  Physical Exam Triage Vital Signs ED Triage Vitals  Encounter Vitals Group     BP 12/20/22 1305 110/72     Systolic BP Percentile --      Diastolic BP Percentile --      Pulse Rate 12/20/22 1302 89     Resp 12/20/22 1302 16     Temp 12/20/22 1302 98.5 F (36.9 C)     Temp Source 12/20/22 1302 Oral     SpO2 12/20/22 1302 95 %     Weight --      Height --      Head Circumference --      Peak Flow --      Pain Score 12/20/22 1304 0     Pain Loc --      Pain Education --      Exclude from Growth Chart --    No data found.  Updated Vital Signs BP  110/72 (BP Location: Left Arm)   Pulse 89   Temp 98.5 F (36.9 C) (Oral)   Resp 16   LMP 11/23/2022 (Approximate)   SpO2 95%   Visual Acuity Right Eye Distance:   Left Eye Distance:   Bilateral Distance:    Right Eye Near:   Left Eye Near:    Bilateral Near:     Physical Exam Vitals and nursing note reviewed.  Constitutional:      General: She is not in acute distress.    Appearance: Normal appearance. She is not toxic-appearing.  HENT:     Right Ear: Tympanic membrane, ear canal and external ear normal. There is no impacted cerumen.     Left Ear: Tympanic membrane, ear canal and external ear normal. There is no impacted cerumen.     Nose: Nose normal. No congestion.     Mouth/Throat:     Mouth: Mucous membranes are moist.     Pharynx: Oropharynx is clear. No posterior oropharyngeal erythema.  Eyes:     General: No scleral icterus.    Extraocular Movements: Extraocular movements intact.  Cardiovascular:     Rate and Rhythm: Normal rate and regular rhythm.   Pulmonary:     Effort: Pulmonary effort is normal. No respiratory distress.     Breath sounds: Normal breath sounds. No wheezing, rhonchi or rales.  Abdominal:     General: Abdomen is flat. Bowel sounds are normal. There is no distension.     Palpations: Abdomen is soft.     Tenderness: There is no abdominal tenderness. There is no right CVA tenderness, left CVA tenderness or guarding.  Musculoskeletal:     Cervical back: Normal range of motion.  Lymphadenopathy:     Cervical: No cervical adenopathy.  Skin:    General: Skin is warm and dry.     Capillary Refill: Capillary refill takes less than 2 seconds.     Coloration: Skin is not jaundiced or pale.     Findings: No erythema.  Neurological:     Mental Status: She is alert and oriented to person, place, and time.  Psychiatric:        Behavior: Behavior is cooperative.      UC Treatments / Results  Labs (all labs ordered are listed, but only abnormal results are displayed) Labs Reviewed - No data to display  EKG   Radiology No results found.  Procedures Procedures (including critical care time)  Medications Ordered in UC Medications - No data to display  Initial Impression / Assessment and Plan / UC Course  I have reviewed the triage vital signs and the nursing notes.  Pertinent labs & imaging results that were available during my care of the patient were reviewed by me and considered in my medical decision making (see chart for details).   Patient is well-appearing, normotensive, afebrile, not tachycardic, not tachypneic, oxygenating well on room air.    1. Nausea, vomiting, and diarrhea Suspect possible foodborne illness versus viral gastroenteritis Vitals and exam today are reassuring Start Zofran 4 mg ODT every 8 hours as needed for nausea/vomiting Increase hydration with plenty of water Strict ER and return precautions discussed with patient Work excuse given   The patient was given the opportunity to  ask questions.  All questions answered to their satisfaction.  The patient is in agreement to this plan.    Final Clinical Impressions(s) / UC Diagnoses   Final diagnoses:  Nausea, vomiting, and diarrhea     Discharge Instructions  You most likely have a stomach bug or food poisoning.  Take the Zofran as prescribed and as needed for nausea/vomiting.  Make sure you are drinking plenty of fluids.  Eat soft/easy to digest food.  Seek care if pain or symptoms worsen despite treatment.     ED Prescriptions     Medication Sig Dispense Auth. Provider   ondansetron (ZOFRAN-ODT) 4 MG disintegrating tablet Take 1 tablet (4 mg total) by mouth every 8 (eight) hours as needed for nausea or vomiting. 20 tablet Valentino Nose, NP      PDMP not reviewed this encounter.   Valentino Nose, NP 12/20/22 1715

## 2022-12-20 NOTE — ED Triage Notes (Signed)
Pt states low grade fever with N/V/D since yesterday.

## 2022-12-20 NOTE — Discharge Instructions (Addendum)
You most likely have a stomach bug or food poisoning.  Take the Zofran as prescribed and as needed for nausea/vomiting.  Make sure you are drinking plenty of fluids.  Eat soft/easy to digest food.  Seek care if pain or symptoms worsen despite treatment.

## 2023-01-10 ENCOUNTER — Ambulatory Visit: Payer: BLUE CROSS/BLUE SHIELD | Admitting: Family Medicine

## 2023-01-17 ENCOUNTER — Ambulatory Visit: Payer: BLUE CROSS/BLUE SHIELD | Admitting: Family Medicine

## 2023-01-17 ENCOUNTER — Encounter: Payer: Self-pay | Admitting: Family Medicine

## 2023-01-17 VITALS — BP 112/68 | HR 81 | Temp 97.8°F | Ht 59.0 in | Wt 145.0 lb

## 2023-01-17 DIAGNOSIS — F419 Anxiety disorder, unspecified: Secondary | ICD-10-CM | POA: Diagnosis not present

## 2023-01-17 DIAGNOSIS — F32A Depression, unspecified: Secondary | ICD-10-CM | POA: Diagnosis not present

## 2023-01-17 MED ORDER — ESCITALOPRAM OXALATE 20 MG PO TABS: 20.0000 mg | ORAL_TABLET | Freq: Every day | ORAL | 0 refills | Status: DC

## 2023-01-17 NOTE — Progress Notes (Signed)
Subjective:     Patient ID: Evelyn Haney, female    DOB: [birth year], 22 y.o.   MRN: [id number]  Chief Complaint  Patient presents with   Depression    3 month f/u, lexapro increase was not feeling the effects so started doubling up to 20 mg for about 2-3 weeks ago and felt a lot better but ran out 9 days ago and starting to feel some withdrawl    Depression        Associated symptoms include no suicidal ideas.   Discussed the use of AI scribe software for clinical note transcription with the patient, who gave verbal consent to proceed.  History of Present Illness         Here for follow up on anxiety and depression. Increased her lexapro dose to 20 mg on her own. States she felt much better. Mood improved.  Would like to have refill of lexapro at 20 mg dose.   She also reports missing work and requests that I fill out short term disability paperwork.   She has not seen a therapist      01/17/2023    1:35 PM 10/11/2022    1:16 PM 09/13/2022    1:08 PM  Depression screen PHQ 2/9  Decreased Interest 1 1 2   Down, Depressed, Hopeless 1 1 2   PHQ - 2 Score 2 2 4   Altered sleeping 3 3 2   Tired, decreased energy 3 2 3   Change in appetite 3 0 3  Feeling bad or failure about yourself  0 1 0  Trouble concentrating 1 1 0  Moving slowly or fidgety/restless 0 0 0  Suicidal thoughts 0 0 0  PHQ-9 Score 12 9 12   Difficult doing work/chores  Somewhat difficult Very difficult      Health Maintenance Due  Topic Date Due   HIV Screening  Never done   Hepatitis C Screening  Never done   PAP-Cervical Cytology Screening  Never done   PAP SMEAR-Modifier  Never done   INFLUENZA VACCINE  01/03/2023    History reviewed. No pertinent past medical history.  History reviewed. No pertinent surgical history.  Family History  Problem Relation Age of Onset   Healthy Mother    Skin cancer Father     Social History   Socioeconomic History   Marital status: Single    Spouse  name: Not on file   Number of children: Not on file   Years of education: Not on file   Highest education level: Not on file  Occupational History   Not on file  Tobacco Use   Smoking status: Never   Smokeless tobacco: Never  Vaping Use   Vaping status: Never Used  Substance and Sexual Activity   Alcohol use: Yes    Comment: socially   Drug use: Yes    Types: Marijuana    Comment: socially   Sexual activity: Yes    Birth control/protection: Pill, Condom  Other Topics Concern   Not on file  Social History Narrative   Not on file   Social Determinants of Health   Financial Resource Strain: Not on file  Food Insecurity: Not on file  Transportation Needs: Not on file  Physical Activity: Not on file  Stress: Not on file  Social Connections: Not on file  Intimate Partner Violence: Not on file    Outpatient Medications Prior to Visit  Medication Sig Dispense Refill   AUROVELA 24 FE 1-20 MG-MCG(24) tablet Take 1 tablet  by mouth daily.     escitalopram (LEXAPRO) 10 MG tablet TAKE 1 TABLET(10 MG) BY MOUTH AT BEDTIME 30 tablet 0   ondansetron (ZOFRAN-ODT) 4 MG disintegrating tablet Take 1 tablet (4 mg total) by mouth every 8 (eight) hours as needed for nausea or vomiting. (Patient not taking: Reported on 01/17/2023) 20 tablet 0   No facility-administered medications prior to visit.    No Known Allergies  Review of Systems  Respiratory:  Negative for shortness of breath.   Cardiovascular:  Negative for chest pain and palpitations.  Gastrointestinal:  Negative for abdominal pain, constipation, diarrhea, nausea and vomiting.  Genitourinary:  Negative for dysuria, frequency and urgency.  Neurological:  Negative for dizziness and focal weakness.  Psychiatric/Behavioral:  Positive for depression. Negative for suicidal ideas.        Objective:    Physical Exam Constitutional:      General: She is not in acute distress.    Appearance: She is not ill-appearing.  Eyes:      Extraocular Movements: Extraocular movements intact.     Conjunctiva/sclera: Conjunctivae normal.  Cardiovascular:     Rate and Rhythm: Normal rate.  Pulmonary:     Effort: Pulmonary effort is normal.  Musculoskeletal:     Cervical back: Normal range of motion.  Skin:    General: Skin is warm and dry.  Neurological:     General: No focal deficit present.     Mental Status: She is alert and oriented to person, place, and time.  Psychiatric:        Mood and Affect: Mood normal.        Behavior: Behavior normal.        Thought Content: Thought content normal.      BP 112/68 (BP Location: Left Arm, Patient Position: Sitting, Cuff Size: Large)   Pulse 81   Temp 97.8 F (36.6 C) (Temporal)   Ht 4\' 11"  (1.499 m)   Wt 145 lb (65.8 kg)   LMP 11/23/2022 (Approximate)   SpO2 99%   BMI 29.29 kg/m  Wt Readings from Last 3 Encounters:  01/17/23 145 lb (65.8 kg)  10/11/22 143 lb (64.9 kg)  09/13/22 142 lb (64.4 kg)       Assessment & Plan:   Problem List Items Addressed This Visit       Other   Anxiety and depression - Primary   Relevant Medications   escitalopram (LEXAPRO) 20 MG tablet   She adjusted her medication dose on her own without my knowledge. She then ran out of her medication. I will refill Lexapro at the dose she was taking and recommend against making changes on her own in the future.  Filled out forms she requested for her employer. Advised to keep me in the loop going forward.   I have discontinued Mariam D. Polanco's escitalopram and ondansetron. I am also having her start on escitalopram. Additionally, I am having her maintain her Aurovela 24 FE.  Meds ordered this encounter  Medications   escitalopram (LEXAPRO) 20 MG tablet    Sig: Take 1 tablet (20 mg total) by mouth daily.    Dispense:  90 tablet    Refill:  0    Order Specific Question:   Supervising Provider    Answer:   Hillard Danker A [[id number]]

## 2023-01-17 NOTE — Patient Instructions (Addendum)
I recommend following up in 3 months or sooner if needed.   Please schedule with a therapist.   Also, please schedule with an OB/GYN if you do not have one already.    [email or web address]   [email or web address]   [facility] Multiple locations [phone]     [facility] [address] [place], [place] [address]  Phone: [phone]   [facility]  [address], [place], [place] [address]  Phone: [phone]

## 2023-02-28 ENCOUNTER — Ambulatory Visit (HOSPITAL_COMMUNITY): Payer: BLUE CROSS/BLUE SHIELD

## 2023-02-28 ENCOUNTER — Encounter (HOSPITAL_COMMUNITY): Payer: Self-pay

## 2023-02-28 ENCOUNTER — Ambulatory Visit (HOSPITAL_COMMUNITY)
Admission: RE | Admit: 2023-02-28 | Discharge: 2023-02-28 | Disposition: A | Discharge status: Home / Self Care | Payer: BLUE CROSS/BLUE SHIELD | Source: Ambulatory Visit | Attending: Family Medicine | Admitting: Family Medicine

## 2023-02-28 VITALS — BP 113/81 | HR 88 | Temp 98.4°F | Resp 17

## 2023-02-28 DIAGNOSIS — S161XXA Strain of muscle, fascia and tendon at neck level, initial encounter: Secondary | ICD-10-CM | POA: Diagnosis not present

## 2023-02-28 DIAGNOSIS — M545 Low back pain, unspecified: Secondary | ICD-10-CM

## 2023-02-28 LAB — POCT URINE PREGNANCY: Preg Test, Ur: NEGATIVE

## 2023-02-28 MED ORDER — METHOCARBAMOL 500 MG PO TABS: 500.0000 mg | ORAL_TABLET | Freq: Two times a day (BID) | ORAL | 0 refills | Status: DC

## 2023-02-28 MED ORDER — IBUPROFEN 800 MG PO TABS: 800.0000 mg | ORAL_TABLET | Freq: Three times a day (TID) | ORAL | 0 refills | Status: DC

## 2023-02-28 MED ORDER — HYDROCODONE-ACETAMINOPHEN 5-325 MG PO TABS
1.0000 | ORAL_TABLET | Freq: Four times a day (QID) | ORAL | 0 refills | Status: DC | PRN
Start: 2023-02-28 — End: 2023-08-13
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
## 2023-02-28 NOTE — Discharge Instructions (Addendum)
HOME CARE INSTRUCTIONS: For many people, back pain returns. Since low back pain is rarely dangerous, it is often a condition that people can learn to manage on their own. Please remain active. It is stressful on the back to sit or stand in one place. Do not sit, drive, or stand in one place for more than 30 minutes at a time. Take short walks on level surfaces as soon as pain allows. Try to increase the length of time you walk each day. Do not stay in bed. Resting more than 1 or 2 days can delay your recovery. Do not avoid exercise or work. Your body is made to move. It is not dangerous to be active, even though your back may hurt. Your back will likely heal faster if you return to being active before your pain is gone. Over-the-counter medicines to reduce pain and inflammation are often the most helpful.  SEEK MEDICAL CARE IF: You have pain that is not relieved with rest or medicine. You have pain that does not improve in 1 week. You have new symptoms. You are generally not feeling well.  SEEK IMMEDIATE MEDICAL CARE IF: You have pain that radiates from your back into your legs. You develop new bowel or bladder control problems. You have unusual weakness or numbness in your arms or legs. You develop nausea or vomiting. You develop abdominal pain. You feel faint.  Be aware, you have been prescribed pain medications that may cause drowsiness. While taking this medication, do not take any other medications containing acetaminophen (Tylenol). Do not combine with alcohol or recreational drugs. Please do not drive, operate heavy machinery, or take part in activities that require making important decisions while on this medication as your judgement may be clouded.
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
## 2023-05-17 ENCOUNTER — Telehealth: Payer: BLUE CROSS/BLUE SHIELD | Admitting: Family Medicine

## 2023-05-17 DIAGNOSIS — F419 Anxiety disorder, unspecified: Secondary | ICD-10-CM | POA: Diagnosis not present

## 2023-05-17 DIAGNOSIS — F32A Depression, unspecified: Secondary | ICD-10-CM | POA: Diagnosis not present

## 2023-05-17 MED ORDER — ESCITALOPRAM OXALATE 20 MG PO TABS
20.0000 mg | ORAL_TABLET | Freq: Every day | ORAL | 0 refills | Status: DC
Start: 2023-05-17 — End: 2023-08-13

## 2023-05-17 NOTE — Patient Instructions (Addendum)
Buddy Duty, thank you for joining Evelyn Finner, NP for today's virtual visit.  While this provider is not your primary care provider (PCP), if your PCP is located in our provider database this encounter information will be shared with them immediately following your visit.   A [facility] MyChart account gives you access to today's visit and all your visits, tests, and labs performed at [facility] " click here if you don't have a [facility] MyChart account or go to [email or web address]  Consent: (Patient) Sommar Barbie provided verbal consent for this virtual visit at the beginning of the encounter.  Current Medications:  Current Outpatient Medications:    escitalopram (LEXAPRO) 20 MG tablet, Take 1 tablet (20 mg total) by mouth daily., Disp: 30 tablet, Rfl: 0   AUROVELA 24 FE 1-20 MG-MCG(24) tablet, Take 1 tablet by mouth daily., Disp: , Rfl:    HYDROcodone-acetaminophen (NORCO/VICODIN) 5-325 MG tablet, Take 1 tablet by mouth every 6 (six) hours as needed for moderate pain or severe pain., Disp: 8 tablet, Rfl: 0   ibuprofen (ADVIL) 800 MG tablet, Take 1 tablet (800 mg total) by mouth 3 (three) times daily with meals., Disp: 21 tablet, Rfl: 0   methocarbamol (ROBAXIN) 500 MG tablet, Take 1 tablet (500 mg total) by mouth 2 (two) times daily., Disp: 20 tablet, Rfl: 0   Medications ordered in this encounter:  Meds ordered this encounter  Medications   escitalopram (LEXAPRO) 20 MG tablet    Sig: Take 1 tablet (20 mg total) by mouth daily.    Dispense:  30 tablet    Refill:  0    Supervising Provider:   Merrilee Jansky [[id number]]     *If you need refills on other medications prior to your next appointment, please contact your pharmacy*  Follow-Up: Call back or seek an in-person evaluation if the symptoms worsen or if the condition fails to improve as anticipated.  [facility] [phone]  Other Instructions  Managing Anxiety,  Adult After being diagnosed with anxiety, you may be relieved to know why you have felt or behaved a certain way. You may also feel overwhelmed about the treatment ahead and what it will mean for your life. With care and support, you can manage your anxiety. How to manage lifestyle changes Understanding the difference between stress and anxiety Although stress can play a role in anxiety, it is not the same as anxiety. Stress is your body's reaction to life changes and events, both good and bad. Stress is often caused by something external, such as a deadline, test, or competition. It normally goes away after the event has ended and will last just a few hours. But, stress can be ongoing and can lead to more than just stress. Anxiety is caused by something internal, such as imagining a terrible outcome or worrying that something will go wrong that will greatly upset you. Anxiety often does not go away even after the event is over, and it can become a long-term (chronic) worry. Lowering stress and anxiety Talk with your health care provider or a counselor to learn more about lowering anxiety and stress. They may suggest tension-reduction techniques, such as: Music. Spend time creating or listening to music that you enjoy and that inspires you. Mindfulness-based meditation. Practice being aware of your normal breaths while not trying to control your breathing. It can be done while sitting or walking. Centering prayer. Focus on a word, phrase, or sacred image that means  something to you and brings you peace. Deep breathing. Expand your stomach and inhale slowly through your nose. Hold your breath for 3-5 seconds. Then breathe out slowly, letting your stomach muscles relax. Self-talk. Learn to notice and spot thought patterns that lead to anxiety reactions. Change those patterns to thoughts that feel peaceful. Muscle relaxation. Take time to tense muscles and then relax them. Choose a tension-reduction  technique that fits your lifestyle and personality. These techniques take time and practice. Set aside 5-15 minutes a day to do them. Specialized therapists can offer counseling and training in these techniques. The training to help with anxiety may be covered by some insurance plans. Other things you can do to manage stress and anxiety include: Keeping a stress diary. This can help you learn what triggers your reaction and then learn ways to manage your response. Thinking about how you react to certain situations. You may not be able to control everything, but you can control your response. Making time for activities that help you relax and not feeling guilty about spending your time in this way. Doing visual imagery. This involves imagining or creating mental pictures to help you relax. Practicing yoga. Through yoga poses, you can lower tension and relax.  Medicines Medicines for anxiety include: Antidepressant medicines. These are usually prescribed for long-term daily control. Anti-anxiety medicines. These may be added in severe cases, especially when panic attacks occur. When used together, medicines, psychotherapy, and tension-reduction techniques may be the most effective treatment. Relationships Relationships can play a big part in helping you recover. Spend more time connecting with trusted friends and family members. Think about going to couples counseling if you have a partner, taking family education classes, or going to family therapy. Therapy can help you and others better understand your anxiety. How to recognize changes in your anxiety Everyone responds differently to treatment for anxiety. Recovery from anxiety happens when symptoms lessen and stop interfering with your daily life at home or work. This may mean that you will start to: Have better concentration and focus. Worry will interfere less in your daily thinking. Sleep better. Be less irritable. Have more energy. Have  improved memory. Try to recognize when your condition is getting worse. Contact your provider if your symptoms interfere with home or work and you feel like your condition is not improving. Follow these instructions at home: Activity Exercise. Adults should: Exercise for at least 150 minutes each week. The exercise should increase your heart rate and make you sweat (moderate-intensity exercise). Do strengthening exercises at least twice a week. Get the right amount and quality of sleep. Most adults need 7-9 hours of sleep each night. Lifestyle  Eat a healthy diet that includes plenty of vegetables, fruits, whole grains, low-fat dairy products, and lean protein. Do not eat a lot of foods that are high in fats, added sugars, or salt (sodium). Make choices that simplify your life. Do not use any products that contain nicotine or tobacco. These products include cigarettes, chewing tobacco, and vaping devices, such as e-cigarettes. If you need help quitting, ask your provider. Avoid caffeine, alcohol, and certain over-the-counter cold medicines. These may make you feel worse. Ask your pharmacist which medicines to avoid. General instructions Take over-the-counter and prescription medicines only as told by your provider. Keep all follow-up visits. This is to make sure you are managing your anxiety well or if you need more support. Where to find support You can get help and support from: Self-help groups. Online and community  organizations. A trusted spiritual leader. Couples counseling. Family education classes. Family therapy. Where to find more information You may find that joining a support group helps you deal with your anxiety. The following sources can help you find counselors or support groups near you: Mental Health America: mentalhealthamerica.net Anxiety and Depression Association of [place] (ADAA): adaa.org The [employer] on Mental Illness (NAMI): nami.org Contact a health care  provider if: You have a hard time staying focused or finishing tasks. You spend many hours a day feeling worried about everyday life. You are very tired because you cannot stop worrying. You start to have headaches or often feel tense. You have chronic nausea or diarrhea. Get help right away if: Your heart feels like it is racing. You have shortness of breath. You have thoughts of hurting yourself or others. Get help right away if you feel like you may hurt yourself or others, or have thoughts about taking your own life. Go to your nearest emergency room or: Call 911. Call the National Suicide Prevention Lifeline at 309-129-6650 or 988. This is open 24 hours a day. Text the Crisis Text Line at 303-740-3525. This information is not intended to replace advice given to you by your health care provider. Make sure you discuss any questions you have with your health care provider. Document Revised: 02/27/2022 Document Reviewed: 09/11/2020 Elsevier Patient Education  2024 Elsevier Inc.   If you have been instructed to have an in-person evaluation today at a local Urgent Care facility, please use the link below. It will take you to a list of all of our available [facility], including address, phone number and hours of operation. Please do not delay care.  [facility]  If you or a family member do not have a primary care provider, use the link below to schedule a visit and establish care. When you choose a [facility] primary care physician or advanced practice provider, you gain a long-term partner in health. Find a Primary Care Provider  Learn more about [facility]'s in-office and virtual care options:  - Get Care Now

## 2023-05-17 NOTE — Progress Notes (Signed)
Virtual Visit Consent   Charniqua Vanmarter, you are scheduled for a virtual visit with a [facility] provider today. Just as with appointments in the office, your consent must be obtained to participate. Your consent will be active for this visit and any virtual visit you may have with one of our providers in the next 365 days. If you have a MyChart account, a copy of this consent can be sent to you electronically.  As this is a virtual visit, video technology does not allow for your provider to perform a traditional examination. This may limit your provider's ability to fully assess your condition. If your provider identifies any concerns that need to be evaluated in person or the need to arrange testing (such as labs, EKG, etc.), we will make arrangements to do so. Although advances in technology are sophisticated, we cannot ensure that it will always work on either your end or our end. If the connection with a video visit is poor, the visit may have to be switched to a telephone visit. With either a video or telephone visit, we are not always able to ensure that we have a secure connection.  By engaging in this virtual visit, you consent to the provision of healthcare and authorize for your insurance to be billed (if applicable) for the services provided during this visit. Depending on your insurance coverage, you may receive a charge related to this service.  I need to obtain your verbal consent now. Are you willing to proceed with your visit today? Evelyn Haney has provided verbal consent on 05/17/2023 for a virtual visit (video or telephone). Freddy Finner, NP  Date: 05/17/2023 2:35 PM  Virtual Visit via Video Note   I, Freddy Finner, connected with  Evelyn Haney  ([id number], December 04, 2000) on 05/17/23 at  2:30 PM EST by a video-enabled telemedicine application and verified that I am speaking with the correct person using two identifiers.  Location: Patient: Virtual Visit  Location Patient: Home Provider: Virtual Visit Location Provider: Home Office   I discussed the limitations of evaluation and management by telemedicine and the availability of in person appointments. The patient expressed understanding and agreed to proceed.    History of Present Illness: Evelyn Haney is a 22 y.o. who identifies as a female who was assigned female at birth, and is being seen today for refill of lexapro medication.  Reports she has noted some increased anxiety and feelings of depression, but also notes she reduced the medication to half dose as she did not have a refill. She had not called her PCP for a refill as of yet.    Problems:  Patient Active Problem List   Diagnosis Date Noted   Anxiety and depression 01/17/2023    Allergies: No Known Allergies Medications:  Current Outpatient Medications:    AUROVELA 24 FE 1-20 MG-MCG(24) tablet, Take 1 tablet by mouth daily., Disp: , Rfl:    escitalopram (LEXAPRO) 20 MG tablet, Take 1 tablet (20 mg total) by mouth daily., Disp: 90 tablet, Rfl: 0   HYDROcodone-acetaminophen (NORCO/VICODIN) 5-325 MG tablet, Take 1 tablet by mouth every 6 (six) hours as needed for moderate pain or severe pain., Disp: 8 tablet, Rfl: 0   ibuprofen (ADVIL) 800 MG tablet, Take 1 tablet (800 mg total) by mouth 3 (three) times daily with meals., Disp: 21 tablet, Rfl: 0   methocarbamol (ROBAXIN) 500 MG tablet, Take 1 tablet (500 mg total) by mouth 2 (two) times daily., Disp:  20 tablet, Rfl: 0  Observations/Objective: Patient is well-developed, well-nourished in no acute distress.  Resting comfortably  at home.  Head is normocephalic, atraumatic.  No labored breathing.  Speech is clear and coherent with logical content.  Patient is alert and oriented at baseline.    Assessment and Plan:  1. Anxiety and depression (Primary)  - escitalopram (LEXAPRO) 20 MG tablet; Take 1 tablet (20 mg total) by mouth daily.  Dispense: 30 tablet; Refill:  0  -one time one month refill, advised against changing her dose and to follow up with her PCP for refills going forward and or dose and med changes as needed -Denies HI and SI  -encouraged sunshine daily -other anxiety tips on AVS as well   Reviewed side effects, risks and benefits of medication.    Patient acknowledged agreement and understanding of the plan.   Past Medical, Surgical, Social History, Allergies, and Medications have been Reviewed.    Follow Up Instructions: I discussed the assessment and treatment plan with the patient. The patient was provided an opportunity to ask questions and all were answered. The patient agreed with the plan and demonstrated an understanding of the instructions.  A copy of instructions were sent to the patient via MyChart unless otherwise noted below.   The patient was advised to call back or seek an in-person evaluation if the symptoms worsen or if the condition fails to improve as anticipated.    Freddy Finner, NP

## 2023-08-13 ENCOUNTER — Ambulatory Visit
Admission: RE | Admit: 2023-08-13 | Discharge: 2023-08-13 | Disposition: A | Discharge status: Home / Self Care | Source: Ambulatory Visit | Attending: Family Medicine | Admitting: Family Medicine

## 2023-08-13 VITALS — BP 101/67 | HR 97 | Temp 99.6°F | Resp 16

## 2023-08-13 DIAGNOSIS — J4530 Mild persistent asthma, uncomplicated: Secondary | ICD-10-CM | POA: Diagnosis not present

## 2023-08-13 DIAGNOSIS — B9789 Other viral agents as the cause of diseases classified elsewhere: Secondary | ICD-10-CM

## 2023-08-13 DIAGNOSIS — J988 Other specified respiratory disorders: Secondary | ICD-10-CM | POA: Diagnosis not present

## 2023-08-13 MED ORDER — ALBUTEROL SULFATE HFA 108 (90 BASE) MCG/ACT IN AERS
1.0000 | INHALATION_SPRAY | Freq: Four times a day (QID) | RESPIRATORY_TRACT | 0 refills | Status: AC | PRN
Start: 2023-08-13 — End: ?

## 2023-08-13 MED ORDER — PREDNISONE 20 MG PO TABS: ORAL_TABLET | ORAL | 0 refills | Status: DC

## 2023-08-13 MED ORDER — PROMETHAZINE-DM 6.25-15 MG/5ML PO SYRP: 5.0000 mL | ORAL_SOLUTION | Freq: Three times a day (TID) | ORAL | 0 refills | Status: DC | PRN

## 2023-08-13 NOTE — Discharge Instructions (Addendum)
 We will manage this as a viral syndrome. For sore throat or cough try using a honey-based tea. Use 3 teaspoons of honey with juice squeezed from half lemon. Place shaved pieces of ginger into 1/2-1 cup of water and warm over stove top. Then mix the ingredients and repeat every 4 hours as needed. Please take Tylenol 500mg -650mg  once every 6 hours for fevers, aches and pains. Hydrate very well with at least 2 liters (64 ounces) of water. Eat light meals such as soups (chicken and noodles, chicken wild rice, vegetable).  Do not eat any foods that you are allergic to.  Start an antihistamine like Zyrtec (10mg  daily) for postnasal drainage, sinus congestion.  You can take this together with prednisone and albuterol inhaler. Use cough syrup as needed.

## 2023-08-13 NOTE — ED Triage Notes (Signed)
 Cough,congestion and sneezing started 3 days ago.  Denies any nausea,diarrhea or vomiting.

## 2023-08-13 NOTE — ED Provider Notes (Signed)
 [facility]  Note:  This document was prepared using Conservation officer, historic buildings and may include unintentional dictation errors.  MRN: [id number] DOB: 04/23/2001  Subjective:   Evelyn Haney is a 23 y.o. female presenting for 3 day history of nasal congestion, sneezing, sinus headaches, productive cough, body aches. No fever, chest pain, shob, wheezing. Has mild asthma, uses inhalers and last had to use this a year ago. No smoking of any kind including cigarettes, cigars, vaping, marijuana use.    No current facility-administered medications for this encounter.  Current Outpatient Medications:    AUROVELA 24 FE 1-20 MG-MCG(24) tablet, Take 1 tablet by mouth daily., Disp: , Rfl:    escitalopram (LEXAPRO) 20 MG tablet, Take 1 tablet (20 mg total) by mouth daily., Disp: 30 tablet, Rfl: 0   HYDROcodone-acetaminophen (NORCO/VICODIN) 5-325 MG tablet, Take 1 tablet by mouth every 6 (six) hours as needed for moderate pain or severe pain., Disp: 8 tablet, Rfl: 0   ibuprofen (ADVIL) 800 MG tablet, Take 1 tablet (800 mg total) by mouth 3 (three) times daily with meals., Disp: 21 tablet, Rfl: 0   methocarbamol (ROBAXIN) 500 MG tablet, Take 1 tablet (500 mg total) by mouth 2 (two) times daily., Disp: 20 tablet, Rfl: 0   No Known Allergies  History reviewed. No pertinent past medical history.   History reviewed. No pertinent surgical history.  Family History  Problem Relation Age of Onset   Healthy Mother    Skin cancer Father     Social History   Tobacco Use   Smoking status: Never   Smokeless tobacco: Never  Vaping Use   Vaping status: Every Day  Substance Use Topics   Alcohol use: Yes    Comment: socially   Drug use: Yes    Types: Marijuana    Comment: socially    ROS   Objective:   Vitals: BP 101/67 (BP Location: Left Arm)   Pulse 97   Temp 99.6 F (37.6 C) (Oral)   Resp 16   LMP 07/20/2023 (Approximate)   SpO2 97%   Physical  Exam Constitutional:      General: She is not in acute distress.    Appearance: Normal appearance. She is well-developed and normal weight. She is not ill-appearing, toxic-appearing or diaphoretic.  HENT:     Head: Normocephalic and atraumatic.     Right Ear: Tympanic membrane, ear canal and external ear normal. No drainage or tenderness. No middle ear effusion. There is no impacted cerumen. Tympanic membrane is not erythematous or bulging.     Left Ear: Tympanic membrane, ear canal and external ear normal. No drainage or tenderness.  No middle ear effusion. There is no impacted cerumen. Tympanic membrane is not erythematous or bulging.     Nose: Congestion present. No rhinorrhea.     Mouth/Throat:     Mouth: Mucous membranes are moist. No oral lesions.     Pharynx: No pharyngeal swelling, oropharyngeal exudate, posterior oropharyngeal erythema or uvula swelling.     Tonsils: No tonsillar exudate or tonsillar abscesses.  Eyes:     General: No scleral icterus.       Right eye: No discharge.        Left eye: No discharge.     Extraocular Movements: Extraocular movements intact.     Right eye: Normal extraocular motion.     Left eye: Normal extraocular motion.     Conjunctiva/sclera: Conjunctivae normal.  Cardiovascular:     Rate  and Rhythm: Normal rate and regular rhythm.     Heart sounds: Normal heart sounds. No murmur heard.    No friction rub. No gallop.  Pulmonary:     Effort: Pulmonary effort is normal. No respiratory distress.     Breath sounds: No stridor. No wheezing, rhonchi or rales.  Chest:     Chest wall: No tenderness.  Musculoskeletal:     Cervical back: Normal range of motion and neck supple.  Lymphadenopathy:     Cervical: No cervical adenopathy.  Skin:    General: Skin is warm and dry.  Neurological:     General: No focal deficit present.     Mental Status: She is alert and oriented to person, place, and time.  Psychiatric:        Mood and Affect: Mood normal.         Behavior: Behavior normal.     Assessment and Plan :   PDMP not reviewed this encounter.  1. Viral respiratory infection   2. Mild persistent asthma without complication    Patient has a history of asthmatic bronchitis, viral bronchitis that is very difficult on her.  As such, recommended an oral prednisone course.  Refilled her albuterol.  Use cough syrup and general supportive care as well.  Deferred testing and patient was in agreement.  Counseled patient on potential for adverse effects with medications prescribed/recommended today, ER and return-to-clinic precautions discussed, patient verbalized understanding.    Wallis Bamberg, PA-C 08/13/23 1755

## 2024-03-13 ENCOUNTER — Ambulatory Visit (HOSPITAL_COMMUNITY)
Admission: EM | Admit: 2024-03-13 | Discharge: 2024-03-13 | Disposition: A | Discharge status: Home / Self Care | Payer: Self-pay

## 2024-03-13 ENCOUNTER — Encounter (HOSPITAL_COMMUNITY): Payer: Self-pay

## 2024-03-13 DIAGNOSIS — F32A Depression, unspecified: Secondary | ICD-10-CM

## 2024-03-13 DIAGNOSIS — Z760 Encounter for issue of repeat prescription: Secondary | ICD-10-CM

## 2024-03-13 DIAGNOSIS — F419 Anxiety disorder, unspecified: Secondary | ICD-10-CM

## 2024-03-13 HISTORY — DX: Anxiety disorder, unspecified: F41.9

## 2024-03-13 HISTORY — DX: Depression, unspecified: F32.A

## 2024-03-13 MED ORDER — ESCITALOPRAM OXALATE 20 MG PO TABS: 20.0000 mg | ORAL_TABLET | Freq: Every day | ORAL | 0 refills | Status: AC

## 2024-03-13 NOTE — ED Provider Notes (Signed)
 [facility]    CSN: [id number] Arrival date & time: 03/13/24  1634      History   Chief Complaint Chief Complaint  Patient presents with   Medication Refill    HPI Evelyn Haney is a 23 y.o. female.   23 year old female presents urgent care needing refill for Lexapro .  She reports that she has been trying to make her prescriptions of Lexapro  to last longer as she has had difficulty following up with her primary care provider.  She has been spacing her dosing out but has completely run out now and feels that she is having increasing anxiety and palpitations as well as sweats.  She has had similar symptoms when she has run out in the past.  She denies any fevers, shortness of breath, SI/HI.   Medication Refill   Past Medical History:  Diagnosis Date   Anxiety    Depression     Patient Active Problem List   Diagnosis Date Noted   Anxiety and depression 01/17/2023    History reviewed. No pertinent surgical history.  OB History   No obstetric history on file.      Home Medications    Prior to Admission medications   Medication Sig Start Date End Date Taking? Authorizing Provider  escitalopram  (LEXAPRO ) 20 MG tablet Take 1 tablet (20 mg total) by mouth daily. 03/13/24  Yes Merideth Bosque A, PA-C  albuterol  (VENTOLIN  HFA) 108 (90 Base) MCG/ACT inhaler Inhale 1-2 puffs into the lungs every 6 (six) hours as needed for wheezing or shortness of breath. 08/13/23   Christopher Savannah, PA-C    Family History Family History  Problem Relation Age of Onset   Healthy Mother    Skin cancer Father     Social History Social History   Tobacco Use   Smoking status: Never   Smokeless tobacco: Never  Vaping Use   Vaping status: Former  Substance Use Topics   Alcohol use: Yes    Comment: socially   Drug use: Yes    Types: Marijuana    Comment: socially     Allergies   Patient has no known allergies.   Review of Systems Review of Systems   Constitutional:  Negative for chills and fever.  HENT:  Negative for ear pain and sore throat.   Eyes:  Negative for pain and visual disturbance.  Respiratory:  Negative for cough and shortness of breath.   Cardiovascular:  Positive for palpitations (when anxious). Negative for chest pain.  Gastrointestinal:  Negative for abdominal pain and vomiting.  Genitourinary:  Negative for dysuria and hematuria.  Musculoskeletal:  Negative for arthralgias and back pain.  Skin:  Negative for color change and rash.  Neurological:  Positive for dizziness. Negative for seizures and syncope.  Psychiatric/Behavioral:  Negative for behavioral problems, hallucinations and suicidal ideas. The patient is nervous/anxious.   All other systems reviewed and are negative.    Physical Exam Triage Vital Signs ED Triage Vitals  Encounter Vitals Group     BP 03/13/24 1726 111/74     Girls Systolic BP Percentile --      Girls Diastolic BP Percentile --      Boys Systolic BP Percentile --      Boys Diastolic BP Percentile --      Pulse Rate 03/13/24 1726 87     Resp 03/13/24 1726 18     Temp 03/13/24 1726 99 F (37.2 C)     Temp Source 03/13/24 1726 Oral  SpO2 03/13/24 1726 97 %     Weight 03/13/24 1725 155 lb (70.3 kg)     Height 03/13/24 1725 5' (1.524 m)     Head Circumference --      Peak Flow --      Pain Score 03/13/24 1724 0     Pain Loc --      Pain Education --      Exclude from Growth Chart --    No data found.  Updated Vital Signs BP 111/74 (BP Location: Right Arm)   Pulse 87   Temp 99 F (37.2 C) (Oral)   Resp 18   Ht 5' (1.524 m)   Wt 155 lb (70.3 kg)   LMP 01/10/2024 (Approximate)   SpO2 97%   BMI 30.27 kg/m   Visual Acuity Right Eye Distance:   Left Eye Distance:   Bilateral Distance:    Right Eye Near:   Left Eye Near:    Bilateral Near:     Physical Exam Vitals and nursing note reviewed.  Constitutional:      General: She is not in acute distress.     Appearance: Normal appearance. She is well-developed.  HENT:     Head: Normocephalic and atraumatic.  Eyes:     Conjunctiva/sclera: Conjunctivae normal.  Cardiovascular:     Rate and Rhythm: Normal rate and regular rhythm.     Heart sounds: No murmur heard. Pulmonary:     Effort: Pulmonary effort is normal. No respiratory distress.     Breath sounds: Normal breath sounds.  Abdominal:     Palpations: Abdomen is soft.     Tenderness: There is no abdominal tenderness.  Musculoskeletal:        General: No swelling.     Cervical back: Neck supple.  Skin:    General: Skin is warm and dry.     Capillary Refill: Capillary refill takes less than 2 seconds.  Neurological:     Mental Status: She is alert.  Psychiatric:        Mood and Affect: Mood normal.        Behavior: Behavior normal.        Thought Content: Thought content normal.        Judgment: Judgment normal.      UC Treatments / Results  Labs (all labs ordered are listed, but only abnormal results are displayed) Labs Reviewed - No data to display  EKG   Radiology No results found.  Procedures Procedures (including critical care time)  Medications Ordered in UC Medications - No data to display  Initial Impression / Assessment and Plan / UC Course  I have reviewed the triage vital signs and the nursing notes.  Pertinent labs & imaging results that were available during my care of the patient were reviewed by me and considered in my medical decision making (see chart for details).     Medication refill  Anxiety and depression   No SI/HI. Refill for 1 month supply of Lexapro  20 mg daily sent in to your pharmacy. Make a follow up appointment with your primary care provider as soon as possible as long-term management of this medication needs to come from the primary care provider.  Information for the behavioral health urgent care clinic provided today and can utilize this as well.  Return to urgent care as  needed.  Final Clinical Impressions(s) / UC Diagnoses   Final diagnoses:  Medication refill  Anxiety and depression     Discharge Instructions  Refill for 1 month supply of Lexapro  20 mg daily sent in to your pharmacy. Make a follow up appointment with your primary care provider as soon as possible as long-term management of this medication needs to come from the primary care provider.  Information for the behavioral health urgent care clinic provided today and can utilize this as well.  Return to urgent care as needed.    ED Prescriptions     Medication Sig Dispense Auth. Provider   escitalopram  (LEXAPRO ) 20 MG tablet Take 1 tablet (20 mg total) by mouth daily. 30 tablet Teresa Almarie LABOR, [place]      PDMP not reviewed this encounter.   Teresa Almarie LABOR, [place] 03/13/24 1755

## 2024-03-13 NOTE — ED Triage Notes (Signed)
 Patient states she is having increased anxiety, dizzy, sweats, and heart palpitations on and off for a few days, worse today. States this happens when she needs a refill of her SSRI and runs out.   Patient states she is on Lexapro  20 mg. States her last dose was this Monday.

## 2024-03-13 NOTE — Discharge Instructions (Addendum)
 Refill for 1 month supply of Lexapro  20 mg daily sent in to your pharmacy. Make a follow up appointment with your primary care provider as soon as possible as long-term management of this medication needs to come from the primary care provider.  Information for the behavioral health urgent care clinic provided today and can utilize this as well.  Return to urgent care as needed.

## 2024-03-14 ENCOUNTER — Ambulatory Visit (HOSPITAL_COMMUNITY): Payer: Self-pay
